# Patient Record
Sex: Male | Born: 1995 | Race: White | Hispanic: No | Marital: Single | Smoking: Current every day smoker
Health system: Southern US, Community
[De-identification: ages and names within clinical notes are randomized; demographics above are authoritative.]

---
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
## 2020-04-23 ENCOUNTER — Encounter (HOSPITAL_COMMUNITY): Payer: Self-pay

## 2020-04-23 ENCOUNTER — Emergency Department (HOSPITAL_COMMUNITY): Payer: Self-pay

## 2020-04-23 ENCOUNTER — Other Ambulatory Visit: Payer: Self-pay

## 2020-04-23 DIAGNOSIS — R0789 Other chest pain: Secondary | ICD-10-CM | POA: Insufficient documentation

## 2020-04-23 LAB — BASIC METABOLIC PANEL
Anion gap: 12 (ref 5–15)
BUN: 11 mg/dL (ref 6–20)
CO2: 25 mmol/L (ref 22–32)
Calcium: 10.2 mg/dL (ref 8.9–10.3)
Chloride: 103 mmol/L (ref 98–111)
Creatinine, Ser: 1.02 mg/dL (ref 0.61–1.24)
GFR, Estimated: >60 mL/min (ref >60)
Glucose, Bld: 108 mg/dL — ABNORMAL HIGH (ref 70–99)
Potassium: 3.6 mmol/L (ref 3.5–5.1)
Sodium: 140 mmol/L (ref 135–145)

## 2020-04-23 LAB — CBC
HCT: 46.5 % (ref 39.0–52.0)
Hemoglobin: 16.4 g/dL (ref 13.0–17.0)
MCH: 31.7 pg (ref 26.0–34.0)
MCHC: 35.3 g/dL (ref 30.0–36.0)
MCV: 89.8 fL (ref 80.0–100.0)
Platelets: 245 10*3/uL (ref 150–400)
RBC: 5.18 MIL/uL (ref 4.22–5.81)
RDW: 12.3 % (ref 11.5–15.5)
WBC: 7.2 10*3/uL (ref 4.0–10.5)
nRBC: 0 % (ref 0.0–0.2)

## 2020-04-23 LAB — TROPONIN I (HIGH SENSITIVITY): Troponin I (High Sensitivity): <2 ng/L (ref <18)

## 2020-04-23 NOTE — ED Triage Notes (Signed)
Pt reports left sided chest pain on going for a week. Pt describes it as tightness that increased today. Seen at hospital for same earlier.

## 2020-04-23 NOTE — ED Notes (Signed)
Blue top tube sent with save label with ordered labs.

## 2020-04-24 ENCOUNTER — Emergency Department (HOSPITAL_COMMUNITY)
Admission: EM | Admit: 2020-04-24 | Discharge: 2020-04-24 | Disposition: A | Discharge status: Home / Self Care | Payer: Self-pay | Attending: Emergency Medicine | Admitting: Emergency Medicine

## 2020-04-24 DIAGNOSIS — R0789 Other chest pain: Secondary | ICD-10-CM

## 2020-04-24 LAB — TROPONIN I (HIGH SENSITIVITY): Troponin I (High Sensitivity): <2 ng/L (ref <18)

## 2020-04-24 MED ORDER — METHOCARBAMOL 500 MG PO TABS: 500.0000 mg | ORAL_TABLET | Freq: Three times a day (TID) | ORAL | 0 refills | Status: DC | PRN

## 2020-04-24 MED ORDER — ALUM & MAG HYDROXIDE-SIMETH 200-200-20 MG/5ML PO SUSP
30.0000 mL | Freq: Once | ORAL | Status: AC
  Administered 2020-04-24: 30 mL via ORAL
  Filled 2020-04-24: qty 30

## 2020-04-24 MED ORDER — FAMOTIDINE 20 MG PO TABS
40.0000 mg | ORAL_TABLET | Freq: Once | ORAL | Status: AC
  Administered 2020-04-24: 40 mg via ORAL
  Filled 2020-04-24: qty 2

## 2020-04-24 MED ORDER — HYDROCODONE-ACETAMINOPHEN 5-325 MG PO TABS
1.0000 | ORAL_TABLET | Freq: Once | ORAL | Status: AC
  Administered 2020-04-24: 1 via ORAL
  Filled 2020-04-24: qty 1

## 2020-04-24 MED ORDER — LIDOCAINE VISCOUS HCL 2 % MT SOLN
15.0000 mL | Freq: Once | OROMUCOSAL | Status: AC
  Administered 2020-04-24: 15 mL via ORAL
  Filled 2020-04-24: qty 15

## 2020-04-24 NOTE — ED Notes (Signed)
Pt reports playing golf on Sunday 10/24, and he has been having this same pain since that day.

## 2020-04-24 NOTE — ED Provider Notes (Signed)
[facility]-EMERGENCY DEPT Provider Note   CSN: [id number] Arrival date & time: 04/23/20  2113     History Chief Complaint  Patient presents with  . Chest Pain    David Hurst is a 24 y.o. male.  Patient presents to the emergency department for evaluation of chest pain.  Patient has been experiencing left upper chest pain for 1 week.  Patient reports that he was seen at [facility] earlier today.  He was told that his heart was fine but he continued to have pain.  He was on his way to a party tonight when the pain worsened and he told his friends to bring him to the hospital.  He is not short of breath.  No trauma.  Area is not tender any cannot find any movements that reproduce the pain.        History reviewed. No pertinent past medical history.  There are no problems to display for this patient.   History reviewed. No pertinent surgical history.     No family history on file.  Social History   Tobacco Use  . Smoking status: Never Smoker  . Smokeless tobacco: Never Used  Substance Use Topics  . Alcohol use: No  . Drug use: Not on file    Home Medications Prior to Admission medications   Not on File    Allergies    Patient has no known allergies.  Review of Systems   Review of Systems  Cardiovascular: Positive for chest pain.  All other systems reviewed and are negative.   Physical Exam Updated Vital Signs BP (!) 153/89 (BP Location: Right Arm)   Pulse 88   Temp 98.4 F (36.9 C) (Oral)   Resp 10   Ht 5\' 7"  (1.702 m)   Wt 63.5 kg   SpO2 99%   BMI 21.93 kg/m   Physical Exam Vitals and nursing note reviewed.  Constitutional:      General: He is not in acute distress.    Appearance: Normal appearance. He is well-developed.  HENT:     Head: Normocephalic and atraumatic.     Right Ear: Hearing normal.     Left Ear: Hearing normal.     Nose: Nose normal.  Eyes:     Conjunctiva/sclera: Conjunctivae normal.      Pupils: Pupils are equal, round, and reactive to light.  Cardiovascular:     Rate and Rhythm: Regular rhythm.     Heart sounds: S1 normal and S2 normal. No murmur heard.  No friction rub. No gallop.   Pulmonary:     Effort: Pulmonary effort is normal. No respiratory distress.     Breath sounds: Normal breath sounds.  Chest:     Chest wall: No tenderness.  Abdominal:     General: Bowel sounds are normal.     Palpations: Abdomen is soft.     Tenderness: There is no abdominal tenderness. There is no guarding or rebound. Negative signs include Murphy's sign and McBurney's sign.     Hernia: No hernia is present.  Musculoskeletal:        General: Normal range of motion.     Cervical back: Normal range of motion and neck supple.  Skin:    General: Skin is warm and dry.     Findings: No rash.  Neurological:     Mental Status: He is alert and oriented to person, place, and time.     GCS: GCS eye subscore is 4. GCS verbal  subscore is 5. GCS motor subscore is 6.     Cranial Nerves: No cranial nerve deficit.     Sensory: No sensory deficit.     Coordination: Coordination normal.  Psychiatric:        Speech: Speech normal.        Behavior: Behavior normal.        Thought Content: Thought content normal.     ED Results / Procedures / Treatments   Labs (all labs ordered are listed, but only abnormal results are displayed) Labs Reviewed  BASIC METABOLIC PANEL - Abnormal; Notable for the following components:      Result Value   Glucose, Bld 108 (*)    All other components within normal limits  CBC  TROPONIN I (HIGH SENSITIVITY)  TROPONIN I (HIGH SENSITIVITY)    EKG EKG Interpretation  Date/Time:  Friday April 23 2020 21:26:13 EDT Ventricular Rate:  107 PR Interval:    QRS Duration: 88 QT Interval:  344 QTC Calculation: 459 R Axis:   84 Text Interpretation: Sinus tachycardia Right atrial enlargement ST elev, probable normal early repol pattern 12 Lead; Mason-Likar No  significant change since last tracing Confirmed by Gilda Crease [phone]) on 04/24/2020 12:15:34 AM   Radiology DG Chest 2 View  Result Date: 04/23/2020 CLINICAL DATA:  Left-sided chest pain x1 week. EXAM: CHEST - 2 VIEW COMPARISON:  April 23, 2020 FINDINGS: The heart size and mediastinal contours are within normal limits. Both lungs are clear. The visualized skeletal structures are unremarkable. IMPRESSION: No active cardiopulmonary disease. Electronically Signed   By: Aram Candela M.D.   On: 04/23/2020 21:57    Procedures Procedures (including critical care time)  Medications Ordered in ED Medications  famotidine (PEPCID) tablet 40 mg (has no administration in time range)  alum & mag hydroxide-simeth (MAALOX/MYLANTA) 200-200-20 MG/5ML suspension 30 mL (has no administration in time range)    And  lidocaine (XYLOCAINE) 2 % viscous mouth solution 15 mL (has no administration in time range)  HYDROcodone-acetaminophen (NORCO/VICODIN) 5-325 MG per tablet 1 tablet (has no administration in time range)    ED Course  I have reviewed the triage vital signs and the nursing notes.  Pertinent labs & imaging results that were available during my care of the patient were reviewed by me and considered in my medical decision making (see chart for details).    MDM Rules/Calculators/A&P                          Patient presents to the emergency department with chest pain.  Pain has been present for 1 week.  He has not identified anything that makes it better or worse.  He does not have any cardiac risk factors.  He is PERC/Wells negative.  Work-up here has been reassuring.  EKG shows benign early repolarization, no concerning features.  Troponins negative.  I was able to access records from [facility].  His work-up there was thorough and negative as well including a D-dimer.  He was discharged and told to take Prilosec, did take 1 dose but did not get any improvement so he came  here.  Etiology of the pain is unclear.  I do agree that most likely cause would be GI.  Area is not tender to touch and I cannot reproduce the pain with movements of the torso or arm.  He does, however, report that it started after playing golf.  It still could be musculoskeletal, possible  intercostal muscle.  At this point there is nothing to indicate a serious or life-threatening illness.  Patient reassured, will discharge with analgesia.  Final Clinical Impression(s) / ED Diagnoses Final diagnoses:  Atypical chest pain    Rx / DC Orders ED Discharge Orders    None       Jackelin Correia, Canary Brim, MD 04/24/20 0126

## 2020-12-16 ENCOUNTER — Other Ambulatory Visit: Payer: Self-pay

## 2020-12-16 ENCOUNTER — Emergency Department
Admission: EM | Admit: 2020-12-16 | Discharge: 2020-12-16 | Disposition: A | Discharge status: Home / Self Care | Payer: Self-pay | Attending: Emergency Medicine | Admitting: Emergency Medicine

## 2020-12-16 ENCOUNTER — Encounter: Payer: Self-pay | Admitting: Emergency Medicine

## 2020-12-16 DIAGNOSIS — A499 Bacterial infection, unspecified: Secondary | ICD-10-CM | POA: Insufficient documentation

## 2020-12-16 DIAGNOSIS — L089 Local infection of the skin and subcutaneous tissue, unspecified: Secondary | ICD-10-CM

## 2020-12-16 MED ORDER — HYDROXYZINE HCL 50 MG PO TABS: 50.0000 mg | ORAL_TABLET | Freq: Three times a day (TID) | ORAL | 0 refills | Status: AC | PRN

## 2020-12-16 MED ORDER — SULFAMETHOXAZOLE-TRIMETHOPRIM 800-160 MG PO TABS: 1.0000 | ORAL_TABLET | Freq: Two times a day (BID) | ORAL | 0 refills | Status: AC

## 2020-12-16 MED ORDER — METHYLPREDNISOLONE 4 MG PO TBPK: ORAL_TABLET | ORAL | 0 refills | Status: AC

## 2020-12-16 NOTE — ED Notes (Signed)
See triage note  Presents with swelling to upper lip  States he woke up with swelling to upper lip after shaving on Sunday

## 2020-12-16 NOTE — ED Triage Notes (Signed)
Pt reports shaved Monday and then Tuesday his top lip was swollen. Pt reports not really painful but still swollen

## 2020-12-16 NOTE — Discharge Instructions (Addendum)
Advised to avoid shaving for 10 days.  Use antibacterial soap provided for discharge.  Take medication as directed.

## 2020-12-16 NOTE — ED Provider Notes (Signed)
[facility] Emergency Department Provider Note   ____________________________________________   Event Date/Time   First MD Initiated Contact with Patient 12/16/20 [id number]     (approximate)  I have reviewed the triage vital signs and the nursing notes.   HISTORY  Chief Complaint Mouth Injury    HPI David Hurst is a 25 y.o. male patient presents with edema and erythema to the upper lip.  Patient also has crusted lesions on the upper lip.  Onset of complaint after shaving with a dull razor 1 week ago.  Patient states awaking this morning with yellow-greenish drainage from area.  Denies fever or pain.  No palliative measure for complaint.      History reviewed. No pertinent past medical history.  There are no problems to display for this patient.   History reviewed. No pertinent surgical history.  Prior to Admission medications   Medication Sig Start Date End Date Taking? Authorizing Provider  hydrOXYzine (ATARAX/VISTARIL) 50 MG tablet Take 1 tablet (50 mg total) by mouth 3 (three) times daily as needed for itching. 12/16/20  Yes Joni Reining, PA-C  methylPREDNISolone (MEDROL DOSEPAK) 4 MG TBPK tablet Take Tapered dose as directed 12/16/20  Yes Joni Reining, PA-C  sulfamethoxazole-trimethoprim (BACTRIM DS) 800-160 MG tablet Take 1 tablet by mouth 2 (two) times daily. 12/16/20  Yes Joni Reining, PA-C    Allergies Patient has no known allergies.  No family history on file.  Social History Social History   Tobacco Use   Smoking status: Never   Smokeless tobacco: Never  Substance Use Topics   Alcohol use: No    Review of Systems Constitutional: No fever/chills Eyes: No visual changes. ENT: No sore throat. Cardiovascular: Denies chest pain. Respiratory: Denies shortness of breath. Gastrointestinal: No abdominal pain.  No nausea, no vomiting.  No diarrhea.  No constipation. Genitourinary: Negative for dysuria. Musculoskeletal:  Negative for back pain. Skin: Positive for rash. Neurological: Negative for headaches, focal weakness or numbness.   ____________________________________________   PHYSICAL EXAM:  VITAL SIGNS: ED Triage Vitals  Enc Vitals Group     BP 12/16/20 0735 (!) 107/93     Pulse Rate 12/16/20 0735 90     Resp 12/16/20 0735 18     Temp 12/16/20 0735 98.4 F (36.9 C)     Temp Source 12/16/20 0735 Oral     SpO2 12/16/20 0735 98 %     Weight 12/16/20 0730 140 lb (63.5 kg)     Height 12/16/20 0730 5\' 9"  (1.753 m)     Head Circumference --      Peak Flow --      Pain Score 12/16/20 0730 0     Pain Loc --      Pain Edu? --      Excl. in GC? --     Constitutional: Alert and oriented. Well appearing and in no acute distress. Eyes: Conjunctivae are normal. PERRL. EOMI. Head: Atraumatic. Nose: No congestion/rhinnorhea. Mouth/Throat: Mucous membranes are moist.  Oropharynx non-erythematous. Hematological/Lymphatic/Immunilogical: No cervical lymphadenopathy. Cardiovascular: Normal rate, regular rhythm. Grossly normal heart sounds.  Good peripheral circulation. Respiratory: Normal respiratory effort.  No retractions. Lungs CTAB. Neurologic:  Normal speech and language. No gross focal neurologic deficits are appreciated. No gait instability. Skin: Mild edema and erythema to the upper lip.  Dry yellowish-greenish secretions upper lip.   Psychiatric: Mood and affect are normal. Speech and behavior are normal.  ____________________________________________   LABS (all labs ordered are listed, but  only abnormal results are displayed)  Labs Reviewed - No data to display ____________________________________________  EKG   ____________________________________________  RADIOLOGY I, Joni Reining, personally viewed and evaluated these images (plain radiographs) as part of my medical decision making, as well as reviewing the written report by the radiologist.  ED MD interpretation:     Official radiology report(s): No results found.  ____________________________________________   PROCEDURES  Procedure(s) performed (including Critical Care):  Procedures   ____________________________________________   INITIAL IMPRESSION / ASSESSMENT AND PLAN / ED COURSE  As part of my medical decision making, I reviewed the following data within the electronic MEDICAL RECORD NUMBER         Patient presents with edema and erythema to the upper lip.  Patient complaint physical exam consistent of a bacterial skin infection.  Patient given antibacterial soap to wash area twice a day for 2 to 3 days.  Patient given prescription for Bactrim DS, prednisone, and Atarax.  Patient advised not to shave the area for least 1 week.  Patient [facility]  Return to ED if no improvement or worsening complaint.      ____________________________________________   FINAL CLINICAL IMPRESSION(S) / ED DIAGNOSES  Final diagnoses:  Bacterial skin infection     ED Discharge Orders          Ordered    sulfamethoxazole-trimethoprim (BACTRIM DS) 800-160 MG tablet  2 times daily        12/16/20 0742    methylPREDNISolone (MEDROL DOSEPAK) 4 MG TBPK tablet        12/16/20 0742    hydrOXYzine (ATARAX/VISTARIL) 50 MG tablet  3 times daily PRN        12/16/20 0539             Note:  This document was prepared using Dragon voice recognition software and may include unintentional dictation errors.    Joni Reining, PA-C 12/16/20 0751    Concha Se, MD 12/16/20 [phone]

## 2021-11-26 ENCOUNTER — Encounter: Payer: Self-pay | Admitting: Emergency Medicine

## 2021-11-26 ENCOUNTER — Emergency Department: Payer: Self-pay

## 2021-11-26 ENCOUNTER — Emergency Department
Admission: EM | Admit: 2021-11-26 | Discharge: 2021-11-26 | Disposition: A | Discharge status: Home / Self Care | Payer: Self-pay | Attending: Emergency Medicine | Admitting: Emergency Medicine

## 2021-11-26 ENCOUNTER — Other Ambulatory Visit: Payer: Self-pay

## 2021-11-26 DIAGNOSIS — K3580 Unspecified acute appendicitis: Secondary | ICD-10-CM | POA: Insufficient documentation

## 2021-11-26 DIAGNOSIS — D72829 Elevated white blood cell count, unspecified: Secondary | ICD-10-CM | POA: Insufficient documentation

## 2021-11-26 LAB — CBC
HCT: 45.9 % (ref 39.0–52.0)
Hemoglobin: 15.4 g/dL (ref 13.0–17.0)
MCH: 29.3 pg (ref 26.0–34.0)
MCHC: 33.6 g/dL (ref 30.0–36.0)
MCV: 87.4 fL (ref 80.0–100.0)
Platelets: 361 10*3/uL (ref 150–400)
RBC: 5.25 MIL/uL (ref 4.22–5.81)
RDW: 12.3 % (ref 11.5–15.5)
WBC: 15.6 10*3/uL — ABNORMAL HIGH (ref 4.0–10.5)
nRBC: 0 % (ref 0.0–0.2)

## 2021-11-26 LAB — COMPREHENSIVE METABOLIC PANEL
ALT: 21 U/L (ref 0–44)
AST: 20 U/L (ref 15–41)
Albumin: 4.2 g/dL (ref 3.5–5.0)
Alkaline Phosphatase: 71 U/L (ref 38–126)
Anion gap: 7 (ref 5–15)
BUN: 11 mg/dL (ref 6–20)
CO2: 26 mmol/L (ref 22–32)
Calcium: 8.7 mg/dL — ABNORMAL LOW (ref 8.9–10.3)
Chloride: 107 mmol/L (ref 98–111)
Creatinine, Ser: 0.76 mg/dL (ref 0.61–1.24)
GFR, Estimated: >60 mL/min (ref >60)
Glucose, Bld: 108 mg/dL — ABNORMAL HIGH (ref 70–99)
Potassium: 3.8 mmol/L (ref 3.5–5.1)
Sodium: 140 mmol/L (ref 135–145)
Total Bilirubin: 0.7 mg/dL (ref 0.3–1.2)
Total Protein: 7.6 g/dL (ref 6.5–8.1)

## 2021-11-26 LAB — URINALYSIS, ROUTINE W REFLEX MICROSCOPIC
Bilirubin Urine: NEGATIVE
Glucose, UA: NEGATIVE mg/dL
Hgb urine dipstick: NEGATIVE
Ketones, ur: NEGATIVE mg/dL
Leukocytes,Ua: NEGATIVE
Nitrite: NEGATIVE
Protein, ur: NEGATIVE mg/dL
Specific Gravity, Urine: 1.013 (ref 1.005–1.030)
pH: 7 (ref 5.0–8.0)

## 2021-11-26 LAB — LIPASE, BLOOD: Lipase: 30 U/L (ref 11–51)

## 2021-11-26 MED ORDER — PIPERACILLIN-TAZOBACTAM 3.375 G IVPB 30 MIN
3.3750 g | Freq: Once | INTRAVENOUS | Status: AC
  Administered 2021-11-26: 3.375 g via INTRAVENOUS
  Filled 2021-11-26: qty 50

## 2021-11-26 MED ORDER — ONDANSETRON HCL 4 MG/2ML IJ SOLN
4.0000 mg | Freq: Once | INTRAMUSCULAR | Status: AC
  Administered 2021-11-26: 4 mg via INTRAVENOUS
  Filled 2021-11-26: qty 2

## 2021-11-26 MED ORDER — KETOROLAC TROMETHAMINE 15 MG/ML IJ SOLN
15.0000 mg | Freq: Once | INTRAMUSCULAR | Status: AC
  Administered 2021-11-26: 15 mg via INTRAVENOUS
  Filled 2021-11-26: qty 1

## 2021-11-26 MED ORDER — AMOXICILLIN-POT CLAVULANATE 875-125 MG PO TABS: 1.0000 | ORAL_TABLET | Freq: Two times a day (BID) | ORAL | 0 refills | Status: AC

## 2021-11-26 MED ORDER — IOHEXOL 300 MG/ML  SOLN
100.0000 mL | Freq: Once | INTRAMUSCULAR | Status: AC | PRN
  Administered 2021-11-26: 100 mL via INTRAVENOUS

## 2021-11-26 NOTE — ED Provider Notes (Signed)
[facility] Provider Note    Event Date/Time   First MD Initiated Contact with Patient 11/26/21 1152     (approximate)   History   Abdominal Pain   HPI  David Hurst is a 26 y.o. male no past medical history no prior surgeries presents with right lower quadrant abdominal pain.  For about a week patient has had intermittent vomiting and diarrhea.  Tends to vomit in the morning and overnight but is tolerating p.o. during the day.  This was the first day he had abdominal pain.  Woke up with pain in the right lower quadrant.  It is constant nonradiating denies urinary symptoms no testicular pain or swelling    History reviewed. No pertinent past medical history.  There are no problems to display for this patient.    Physical Exam  Triage Vital Signs: ED Triage Vitals [11/26/21 1133]  Enc Vitals Group     BP 111/85     Pulse Rate 72     Resp 18     Temp 98.5 F (36.9 C)     Temp Source Oral     SpO2 99 %     Weight 140 lb (63.5 kg)     Height 5\' 8"  (1.727 m)     Head Circumference      Peak Flow      Pain Score 8     Pain Loc      Pain Edu?      Excl. in GC?     Most recent vital signs: Vitals:   11/26/21 1133 11/26/21 1400  BP: 111/85 116/71  Pulse: 72 78  Resp: 18 16  Temp: 98.5 F (36.9 C)   SpO2: 99% 100%     General: Awake, no distress.  CV:  Good peripheral perfusion.  Resp:  Normal effort.  Abd:  No distention.  Soft mildly tender in the right lower quadrant Neuro:             Awake, Alert, Oriented x 3  Other:     ED Results / Procedures / Treatments  Labs (all labs ordered are listed, but only abnormal results are displayed) Labs Reviewed  COMPREHENSIVE METABOLIC PANEL - Abnormal; Notable for the following components:      Result Value   Glucose, Bld 108 (*)    Calcium 8.7 (*)    All other components within normal limits  CBC - Abnormal; Notable for the following components:   WBC 15.6 (*)    All other  components within normal limits  URINALYSIS, ROUTINE W REFLEX MICROSCOPIC - Abnormal; Notable for the following components:   Color, Urine YELLOW (*)    APPearance CLEAR (*)    All other components within normal limits  LIPASE, BLOOD     EKG     RADIOLOGY  I reviewed and interpreted the CT abdomen pelvis which shows some enhancement of the wall of the appendix   PROCEDURES:  Critical Care performed: No  Procedures  The patient is on the cardiac monitor to evaluate for evidence of arrhythmia and/or significant heart rate changes.   MEDICATIONS ORDERED IN ED: Medications  piperacillin-tazobactam (ZOSYN) IVPB 3.375 g (3.375 g Intravenous New Bag/Given 11/26/21 1446)  ketorolac (TORADOL) 15 MG/ML injection 15 mg (15 mg Intravenous Given 11/26/21 1232)  ondansetron (ZOFRAN) injection 4 mg (4 mg Intravenous Given 11/26/21 1232)  iohexol (OMNIPAQUE) 300 MG/ML solution 100 mL (100 mLs Intravenous Contrast Given 11/26/21 1346)  IMPRESSION / MDM / ASSESSMENT AND PLAN / ED COURSE  I reviewed the triage vital signs and the nursing notes.                              Patient's presentation is most consistent with acute presentation with potential threat to life or bodily function.  Differential diagnosis includes, but is not limited to, appendicitis, gastroenteritis, renal colic, inflammatory bowel disease, mesenteric adenitis  Patient is a 26 year old male presenting with right lower quadrant pain.  Has had about a week of intermittent vomiting and diarrhea but still tolerating p.o. intermittently.  The pain started this morning and is located in the right lower quadrant.  He appears well on exam overall his abdomen is benign and he does not guard but have subjective pain on palpation of the right lower quadrant.  He has a leukocytosis of 15.  Appendicitis is most concerning on the differential.  We will obtain a CT abdomen pelvis treat pain with Toradol and nausea with Zofran.  He is  NPO.  CT is concerning for potential early appendicitis there is enhancement of the wall and appendix is dilated but there is no inflammatory changes.  I spoke with Dr. Claudine Mouton general surgery who evaluate the patient.  Patient was evaluated by Dr. Claudine Mouton.  Dr. Claudine Mouton discussed with me that he thinks this is likely a very early appendicitis.  He had a discussion with the patient about antibiotics versus surgery and the patient preferred the nonsurgical route.  He recommends discharge with oral Augmentin after getting a dose of IV antibiotics in the ED.  I did question whether would be more beneficial to admit the patient for IV antibiotics if not pursuing the surgical route.  Apparently they discussed admission for IV antibiotics, admission for surgery versus IV antibiotic in the ED and then discharged with outpatient antibiotics.  Patient preferred to be discharged after dose of IV antibiotics in the emergency department.  I discussed this with the patient as well and he does not want to be admitted.  Had discussion with him about immediate return to the ED for worsening symptoms and I explained the risk is for the appendicitis to worsen into actual rupture which would make his course much more complicated.  Dr. Claudine Mouton was comfortable with the patient being discharged with Augmentin.  Will not give anything for pain as will need to see if pain is worsening.  Patient's pain is controlled he is tolerating p.o.   FINAL CLINICAL IMPRESSION(S) / ED DIAGNOSES   Final diagnoses:  Acute appendicitis, unspecified acute appendicitis type     Rx / DC Orders   ED Discharge Orders          Ordered    amoxicillin-clavulanate (AUGMENTIN) 875-125 MG tablet  2 times daily        11/26/21 1514             Note:  This document was prepared using Dragon voice recognition software and may include unintentional dictation errors.   Georga Hacking, MD 11/26/21 1515

## 2021-11-26 NOTE — Discharge Instructions (Addendum)
You CAT scan shows very early appendicitis.  You received a dose of IV antibiotics in the emergency department.  Please take the antibiotic twice a day for the next 14 days.  It is very important that you return to the emergency department if your pain is worsening.  Follow-up with Dr. Claudine Mouton in the office next week.

## 2021-11-26 NOTE — ED Triage Notes (Signed)
Pt via POV from home. Pt c/o RLQ abd pain that started this AM. Also, endorses NVD. Denies fever. Denies urinary symptoms. Denies hx of abd surgeries or kidney stones. Pt is A&OX4 and NAD

## 2021-11-26 NOTE — Consult Note (Signed)
[facility] SURGICAL CONSULTATION NOTE (initial) - cpt: 71245 (Outpatient/ED)   HISTORY OF PRESENT ILLNESS (HPI):  26 y.o. male presented to [facility] ED after a three 3 to 4-day history of vague malaise, nausea and anorexia, vomiting and loose stools, he presents today for evaluation of right lower quadrant pain. Patient reports awakening this morning with right lower quadrant pain/tenderness.  Denies fevers and chills,  Surgery is consulted by ED physician Dr. Sidney Ace in this context for evaluation and management of possible early acute appendicitis.  Has white blood cell count of 15, CT scans consistent with possible early acute appendicitis.  PAST MEDICAL HISTORY (PMH):  History reviewed. No pertinent past medical history.   PAST SURGICAL HISTORY (PSH):  No past surgical history on file.   MEDICATIONS:  Prior to Admission medications   Medication Sig Start Date End Date Taking? Authorizing Provider  hydrOXYzine (ATARAX/VISTARIL) 50 MG tablet Take 1 tablet (50 mg total) by mouth 3 (three) times daily as needed for itching. 12/16/20   Joni Reining, PA-C  methylPREDNISolone (MEDROL DOSEPAK) 4 MG TBPK tablet Take Tapered dose as directed 12/16/20   Joni Reining, PA-C  sulfamethoxazole-trimethoprim (BACTRIM DS) 800-160 MG tablet Take 1 tablet by mouth 2 (two) times daily. 12/16/20   Joni Reining, PA-C     ALLERGIES:  No Known Allergies   SOCIAL HISTORY:  Social History   Socioeconomic History   Marital status: Single    Spouse name: Not on file   Number of children: Not on file   Years of education: Not on file   Highest education level: Not on file  Occupational History   Not on file  Tobacco Use   Smoking status: Every Day    Packs/day: 0.50    Types: Cigarettes   Smokeless tobacco: Never  Substance and Sexual Activity   Alcohol use: Yes   Drug use: Yes    Types: Marijuana   Sexual activity: Not on file  Other Topics Concern   Not on file  Social  History Narrative   Not on file   Social Determinants of Health   Financial Resource Strain: Not on file  Food Insecurity: Not on file  Transportation Needs: Not on file  Physical Activity: Not on file  Stress: Not on file  Social Connections: Not on file  Intimate Partner Violence: Not on file     FAMILY HISTORY:  No family history on file.    REVIEW OF SYSTEMS:  Review of Systems  Constitutional:  Positive for malaise/fatigue. Negative for chills and fever.  HENT: Negative.    Eyes: Negative.   Respiratory: Negative.    Cardiovascular: Negative.   Gastrointestinal:  Positive for abdominal pain, diarrhea, nausea and vomiting.  Genitourinary: Negative.   Skin: Negative.   Neurological: Negative.   Psychiatric/Behavioral: Negative.     VITAL SIGNS:  Temp:  [98.5 F (36.9 C)] 98.5 F (36.9 C) (06/03 1133) Pulse Rate:  [72-78] 78 (06/03 1400) Resp:  [16-18] 16 (06/03 1400) BP: (111-116)/(71-85) 116/71 (06/03 1400) SpO2:  [99 %-100 %] 100 % (06/03 1400) Weight:  [63.5 kg] 63.5 kg (06/03 1133)     Height: 5\' 8"  (172.7 cm) Weight: 63.5 kg BMI (Calculated): 21.29   INTAKE/OUTPUT:  No intake/output data recorded.  PHYSICAL EXAM:  Physical Exam Blood pressure 116/71, pulse 78, temperature 98.5 F (36.9 C), temperature source Oral, resp. rate 16, height 5\' 8"  (1.727 m), weight 63.5 kg, SpO2 100 %. Last Weight  Most recent update:  11/26/2021 11:34 AM    Weight  63.5 kg (140 lb)             CONSTITUTIONAL: Well developed, and nourished, appropriately responsive and aware without distress.   EYES: Sclera non-icteric.   EARS, NOSE, MOUTH AND THROAT:  The oropharynx is clear. Oral mucosa is pink and moist.   Hearing is intact to voice.  NECK: Trachea is midline, and there is no jugular venous distension.  LYMPH NODES:  Lymph nodes in the neck are not enlarged. RESPIRATORY:  Lungs are clear, and breath sounds are equal bilaterally. Normal respiratory effort without  pathologic use of accessory muscles. CARDIOVASCULAR: Heart is regular in rate and rhythm. GI: The abdomen is soft, nontender, and nondistended.  Currently there is some subjective right lower quadrant tenderness, but there is no voluntary involuntary guarding.  There seems to be no limitation in his right hip mobility.  There were no palpable masses. I did not appreciate hepatosplenomegaly. There were normal bowel sounds.  MUSCULOSKELETAL:  Symmetrical muscle tone appreciated in all four extremities.    SKIN: Skin turgor is normal. No pathologic skin lesions appreciated.  NEUROLOGIC:  Motor and sensation appear grossly normal.  Cranial nerves are grossly without defect. PSYCH:  Alert and oriented to person, place and time. Affect is appropriate for situation.  Data Reviewed I have personally reviewed what is currently available of the patient's imaging, recent labs and medical records.    Labs:     Latest Ref Rng & Units 11/26/2021   11:36 AM 04/23/2020    9:31 PM 10/21/2013    1:17 AM  CBC  WBC 4.0 - 10.5 K/uL 15.6   7.2     Hemoglobin 13.0 - 17.0 g/dL 77.8   24.2   35.3    Hematocrit 39.0 - 52.0 % 45.9   46.5   45.0    Platelets 150 - 400 K/uL 361   245         Latest Ref Rng & Units 11/26/2021   11:36 AM 04/23/2020    9:31 PM 10/21/2013    1:17 AM  CMP  Glucose 70 - 99 mg/dL 614   431   540    BUN 6 - 20 mg/dL 11   11   10     Creatinine 0.61 - 1.24 mg/dL   0.86   7.61    Sodium 135 - 145 mmol/L 140   140   141    Potassium 3.5 - 5.1 mmol/L 3.8   3.6   3.1    Chloride 98 - 111 mmol/L 107   103   97    CO2 22 - 32 mmol/L 26   25     Calcium 8.9 - 10.3 mg/dL 8.7   9.50     Total Protein 6.5 - 8.1 g/dL 7.6      Total Bilirubin 0.3 - 1.2 mg/dL 0.7      Alkaline Phos 38 - 126 U/L 71      AST 15 - 41 U/L 20      ALT 0 - 44 U/L 21         Imaging studies:   Last 24 hrs: CT ABDOMEN PELVIS W CONTRAST  Result Date: 11/26/2021 CLINICAL DATA:  26 year old with right lower  quadrant abdominal pain. EXAM: CT ABDOMEN AND PELVIS WITH CONTRAST TECHNIQUE: Multidetector CT imaging of the abdomen and pelvis was performed using the standard protocol following bolus administration of intravenous contrast. RADIATION DOSE REDUCTION: This  exam was performed according to the departmental dose-optimization program which includes automated exposure control, adjustment of the mA and/or kV according to patient size and/or use of iterative reconstruction technique. CONTRAST:  100mL OMNIPAQUE IOHEXOL 300 MG/ML  SOLN COMPARISON:  None Available. FINDINGS: Lower chest: Lung bases are clear.  No pleural effusions. Hepatobiliary: Normal appearance of the liver, gallbladder and portal venous system. No biliary dilatation. Pancreas: Unremarkable. No pancreatic ductal dilatation or surrounding inflammatory changes. Spleen: Normal in size without focal abnormality. Adrenals/Urinary Tract: Normal adrenal glands. Normal appearance of both kidneys without hydronephrosis. No suspicious renal lesions. Normal appearance of the urinary bladder. Stomach/Bowel: Appendix is slightly prominent in the right lower quadrant on sequence 2 image 63 and sequence 5 image 33. Appendix measures roughly 6 mm in diameter with wall enhancement near the tip. These findings are equivocal with regards to an early appendicitis. There is no significant fluid or stranding in this area. Normal appearance of the small bowel and stomach. No evidence for bowel dilatation or obstruction. Vascular/Lymphatic: No significant vascular findings are present. No enlarged abdominal or pelvic lymph nodes. Reproductive: Prostate is unremarkable. Other: Negative for free fluid.  Negative for free air. Musculoskeletal: No acute bone abnormality. IMPRESSION: 1. Appendix is slightly prominent with wall enhancement in the right lower quadrant. No significant inflammatory changes around the appendix. These appendix findings are equivocal. Cannot exclude early  appendicitis. These results were called by telephone at the time of interpretation on 11/26/2021 at 2:05 pm to provider [facility]KELLY MCHUGH , who verbally acknowledged these results. Electronically Signed   By: Richarda OverlieAdam  Henn M.D.   On: 11/26/2021 14:06     Assessment/Plan:  26 y.o. male with likely early acute appendicitis.  There are no problems to display for this patient.  The risks, benefits, complications, treatment options, and expected outcomes were discussed with the patient. The treatment of antibiotics alone was discussed giving a 20% chance that this could fail and surgery would be necessary.  Also discussed proceeding to the operating room for Laparoscopic Appendectomy.  The possibilities of  bleeding, recurrent infection, perforation of viscus, finding a normal appendix, the need for additional procedures, failure to diagnose a condition, conversion to open procedure and creating a complication requiring transfusion or further operations were discussed. The patient was given the opportunity to ask questions and have them answered.    Patient would like to avoid surgery at this time, opting for an initial dose of IV antibiotics and oral antibiotics with discharge home.  We gave him an option of admission for IV antibiotics to be observed over the next 24 hours, but he would rather be discharged.  He is well aware that should he fail to progress over the next day or 2, we should reevaluate proceeding with surgery.   We discussed that we could not prescribe pain medications with his oral course of antibiotics, as I would not want to mask his abdominal pain.   All of the above findings and recommendations were discussed with the patient and  family(if present), and all of patient's and present family's questions were answered to their expressed satisfaction.  Thank you for the opportunity to participate in this patient's care.   -- Campbell Lernerenny Ambreen Tufte, M.D., FACS 11/26/2021, 3:03 PM

## 2022-01-04 ENCOUNTER — Emergency Department
Admission: EM | Admit: 2022-01-04 | Discharge: 2022-01-04 | Disposition: A | Discharge status: Home / Self Care | Payer: Self-pay | Attending: Emergency Medicine | Admitting: Emergency Medicine

## 2022-01-04 ENCOUNTER — Other Ambulatory Visit: Payer: Self-pay

## 2022-01-04 ENCOUNTER — Encounter: Payer: Self-pay | Admitting: Intensive Care

## 2022-01-04 ENCOUNTER — Emergency Department: Payer: Self-pay

## 2022-01-04 DIAGNOSIS — R630 Anorexia: Secondary | ICD-10-CM | POA: Insufficient documentation

## 2022-01-04 DIAGNOSIS — K5900 Constipation, unspecified: Secondary | ICD-10-CM | POA: Insufficient documentation

## 2022-01-04 LAB — COMPREHENSIVE METABOLIC PANEL
ALT: 14 U/L (ref 0–44)
AST: 15 U/L (ref 15–41)
Albumin: 3.9 g/dL (ref 3.5–5.0)
Alkaline Phosphatase: 65 U/L (ref 38–126)
Anion gap: 10 (ref 5–15)
BUN: 8 mg/dL (ref 6–20)
CO2: 29 mmol/L (ref 22–32)
Calcium: 8.9 mg/dL (ref 8.9–10.3)
Chloride: 104 mmol/L (ref 98–111)
Creatinine, Ser: 0.99 mg/dL (ref 0.61–1.24)
GFR, Estimated: >60 mL/min (ref >60)
Glucose, Bld: 103 mg/dL — ABNORMAL HIGH (ref 70–99)
Potassium: 4.3 mmol/L (ref 3.5–5.1)
Sodium: 143 mmol/L (ref 135–145)
Total Bilirubin: 0.5 mg/dL (ref 0.3–1.2)
Total Protein: 7.2 g/dL (ref 6.5–8.1)

## 2022-01-04 LAB — CBC
HCT: 46.6 % (ref 39.0–52.0)
Hemoglobin: 15.4 g/dL (ref 13.0–17.0)
MCH: 29.5 pg (ref 26.0–34.0)
MCHC: 33 g/dL (ref 30.0–36.0)
MCV: 89.3 fL (ref 80.0–100.0)
Platelets: 349 10*3/uL (ref 150–400)
RBC: 5.22 MIL/uL (ref 4.22–5.81)
RDW: 12.1 % (ref 11.5–15.5)
WBC: 6.9 10*3/uL (ref 4.0–10.5)
nRBC: 0 % (ref 0.0–0.2)

## 2022-01-04 LAB — LIPASE, BLOOD: Lipase: 51 U/L (ref 11–51)

## 2022-01-04 MED ORDER — IOHEXOL 300 MG/ML  SOLN
100.0000 mL | Freq: Once | INTRAMUSCULAR | Status: AC | PRN
  Administered 2022-01-04: 100 mL via INTRAVENOUS

## 2022-01-04 MED ORDER — POLYETHYLENE GLYCOL 3350 17 G PO PACK: 17.0000 g | PACK | Freq: Every day | ORAL | 0 refills | Status: AC

## 2022-01-04 MED ORDER — SENNA 8.6 MG PO TABS: 1.0000 | ORAL_TABLET | Freq: Every day | ORAL | 0 refills | Status: AC

## 2022-01-04 NOTE — ED Provider Notes (Signed)
[facility] Provider Note    Event Date/Time   First MD Initiated Contact with Patient 01/04/22 1222     (approximate)   History   Abdominal Pain   HPI  David Hurst is a 26 y.o. male with past medical history of prior early appendicitis treated medically who presents with abdominal pain constipation.  Symptoms started about a week and a half ago.  He endorses significant constipation.  Typically has a BM every day has not had a bowel movement about a week.  Has had some nausea but no vomiting also not much appetite not eating very well.  Has not tried any over-the-counter stool softeners.  Has bilateral lower quadrant abdominal pain worse on the right compared to left.  Denies vomiting fevers chills.  Inks he has lost weight in the last week.  Patient was seen in the ED over a month ago with CT that was concerning for possible early appendicitis, general surgery was consulted and patient was ultimately discharged on oral antibiotics.  He says he was feeling better while taking the antibiotics.    History reviewed. No pertinent past medical history.  There are no problems to display for this patient.    Physical Exam  Triage Vital Signs: ED Triage Vitals  Enc Vitals Group     BP 01/04/22 1110 101/90     Pulse Rate 01/04/22 1110 93     Resp 01/04/22 1110 16     Temp 01/04/22 1110 98.3 F (36.8 C)     Temp Source 01/04/22 1110 Oral     SpO2 01/04/22 1110 100 %     Weight 01/04/22 1115 130 lb (59 kg)     Height 01/04/22 1115 5\' 8"  (1.727 m)     Head Circumference --      Peak Flow --      Pain Score 01/04/22 1115 5     Pain Loc --      Pain Edu? --      Excl. in GC? --     Most recent vital signs: Vitals:   01/04/22 1110  BP: 101/90  Pulse: 93  Resp: 16  Temp: 98.3 F (36.8 C)  SpO2: 100%     General: Awake, no distress.  CV:  Good peripheral perfusion.  Resp:  Normal effort.  Abd:  No distention.  Minimal tenderness in the right  lower quadrant no guarding Neuro:             Awake, Alert, Oriented x 3  Other:     ED Results / Procedures / Treatments  Labs (all labs ordered are listed, but only abnormal results are displayed) Labs Reviewed  COMPREHENSIVE METABOLIC PANEL - Abnormal; Notable for the following components:      Result Value   Glucose, Bld 103 (*)    All other components within normal limits  LIPASE, BLOOD  CBC  URINALYSIS, ROUTINE W REFLEX MICROSCOPIC     EKG     RADIOLOGY    PROCEDURES:  Critical Care performed: No  Procedures  The patient is on the cardiac monitor to evaluate for evidence of arrhythmia and/or significant heart rate changes.   MEDICATIONS ORDERED IN ED: Medications  iohexol (OMNIPAQUE) 300 MG/ML solution 100 mL (100 mLs Intravenous Contrast Given 01/04/22 1317)     IMPRESSION / MDM / ASSESSMENT AND PLAN / ED COURSE  I reviewed the triage vital signs and the nursing notes.  Patient's presentation is most consistent with acute presentation with potential threat to life or bodily function.  Differential diagnosis includes, but is not limited to, appendicitis, bowel obstruction, constipation  Patient is a 26 year old male presenting with about a week and a half of constipation abdominal pain decreased appetite.  Treated last month with oral antibiotics for possible early appendicitis.  Initially felt better but has had worsening symptoms over the last week and a half.  Pain does localize to the right lower quadrant says is not as significant as last time he was here.  Has not had fevers chills.  Vitals are unremarkable and patient looks quite well abdominal exam overall is benign mild tenderness in the right lower quadrant no guarding.  Given his history we will repeat CT today to ensure no complication of appendicitis treated medically.     CT abdomen pelvis shows large stool burden normal appendix.  There is small bowel small bowel  intussusception which radiology comments is usually benign and transient finding.  I did discuss with Dr. Maia Plan he agrees that this is not an acute surgical process.  We will start patient on aggressive bowel regimen with MiraLAX and senna.  We discussed lifestyle changes as well.  Appropriate for discharge.  FINAL CLINICAL IMPRESSION(S) / ED DIAGNOSES   Final diagnoses:  Constipation, unspecified constipation type     Rx / DC Orders   ED Discharge Orders     None        Note:  This document was prepared using Dragon voice recognition software and may include unintentional dictation errors.   Georga Hacking, MD 01/04/22 1421

## 2022-01-04 NOTE — ED Triage Notes (Signed)
Patient c/o right lower quadrant pain that flared up again last week. Reports being seen on 11/26/21 and diagnosed with early appendicitis. Reports diarrhea

## 2022-01-04 NOTE — Discharge Instructions (Addendum)
Please start taking MiraLAX twice a day and the senna daily for constipation.  Please make sure you are staying hydrated and eating a good amount of fiber.  Please follow-up with primary care.

## 2022-01-05 ENCOUNTER — Ambulatory Visit: Payer: Self-pay | Admitting: Surgery

## 2022-04-01 ENCOUNTER — Other Ambulatory Visit: Payer: Self-pay

## 2022-04-01 ENCOUNTER — Emergency Department (HOSPITAL_COMMUNITY)
Admission: EM | Admit: 2022-04-01 | Discharge: 2022-04-02 | Disposition: A | Discharge status: Home / Self Care | Payer: Self-pay | Attending: Emergency Medicine | Admitting: Emergency Medicine

## 2022-04-01 DIAGNOSIS — F10920 Alcohol use, unspecified with intoxication, uncomplicated: Secondary | ICD-10-CM

## 2022-04-01 DIAGNOSIS — F10129 Alcohol abuse with intoxication, unspecified: Secondary | ICD-10-CM | POA: Insufficient documentation

## 2022-04-01 DIAGNOSIS — Y907 Blood alcohol level of 200-239 mg/100 ml: Secondary | ICD-10-CM | POA: Insufficient documentation

## 2022-04-01 MED ORDER — DROPERIDOL 2.5 MG/ML IJ SOLN
5.0000 mg | Freq: Once | INTRAMUSCULAR | Status: DC
  Filled 2022-04-01: qty 2

## 2022-04-01 MED ORDER — LORAZEPAM 2 MG/ML IJ SOLN
2.0000 mg | Freq: Once | INTRAMUSCULAR | Status: AC
  Administered 2022-04-01: 2 mg via INTRAMUSCULAR
  Filled 2022-04-01: qty 1

## 2022-04-01 MED ORDER — DROPERIDOL 2.5 MG/ML IJ SOLN
5.0000 mg | Freq: Once | INTRAMUSCULAR | Status: AC
Start: 2022-04-01 — End: 2022-04-01
  Administered 2022-04-01: 5 mg via INTRAMUSCULAR

## 2022-04-01 NOTE — ED Notes (Signed)
Unable to obtain Temp.

## 2022-04-01 NOTE — ED Triage Notes (Signed)
Pt BIB EMS with reports of ETOH. Pt is alert, non ambulatory.

## 2022-04-01 NOTE — ED Provider Notes (Signed)
[facility] Note   CSN: [id number] Arrival date & time: 04/01/22  2216     History {Add pertinent medical, surgical, social history, OB history to HPI:1} Chief Complaint  Patient presents with   Alcohol Intoxication    David Hurst is a 26 y.o. male who presents via EMS with reports of alcohol use and cocaine use at a house party.  Bystanders called EMS due to erratic behavior.  Patient adamantly denying any alcohol use though is obviously intoxicated.   I personally read patient's medical record 3 does not have any history documented On chart in regards to medical diagnoses but has had intermittent ED visits over the last few years.  No documented medications daily.  HPI     Home Medications Prior to Admission medications   Medication Sig Start Date End Date Taking? Authorizing Provider  hydrOXYzine (ATARAX/VISTARIL) 50 MG tablet Take 1 tablet (50 mg total) by mouth 3 (three) times daily as needed for itching. Patient not taking: Reported on 01/04/2022 12/16/20   Joni Reining, PA-C  methylPREDNISolone (MEDROL DOSEPAK) 4 MG TBPK tablet Take Tapered dose as directed Patient not taking: Reported on 01/04/2022 12/16/20   Joni Reining, PA-C  sulfamethoxazole-trimethoprim (BACTRIM DS) 800-160 MG tablet Take 1 tablet by mouth 2 (two) times daily. Patient not taking: Reported on 01/04/2022 12/16/20   Joni Reining, PA-C      Allergies    Patient has no known allergies.    Review of Systems   Review of Systems  Unable to perform ROS: Other (intoxication)    Physical Exam Updated Vital Signs BP (!) 149/122 (BP Location: Left Arm)   Pulse 88   Resp 16   Ht 5\' 8"  (1.727 m)   Wt 63.5 kg   SpO2 92%   BMI 21.29 kg/m  Physical Exam Vitals and nursing note reviewed.  Constitutional:      Appearance: He is not ill-appearing or toxic-appearing.  HENT:     Head: Normocephalic and atraumatic.     Nose: Nose normal.      Mouth/Throat:     Mouth: Mucous membranes are moist.     Pharynx: No oropharyngeal exudate or posterior oropharyngeal erythema.  Eyes:     General:        Right eye: No discharge.        Left eye: No discharge.     Extraocular Movements: Extraocular movements intact.     Conjunctiva/sclera: Conjunctivae normal.     Pupils: Pupils are equal, round, and reactive to light.  Cardiovascular:     Rate and Rhythm: Normal rate and regular rhythm.     Pulses: Normal pulses.     Heart sounds: Normal heart sounds. No murmur heard. Pulmonary:     Effort: Pulmonary effort is normal. No respiratory distress.     Breath sounds: Normal breath sounds. No wheezing or rales.  Abdominal:     General: Bowel sounds are normal. There is no distension.     Palpations: Abdomen is soft.     Tenderness: There is no abdominal tenderness. There is no guarding or rebound.  Musculoskeletal:        General: No deformity.     Cervical back: Neck supple.     Right lower leg: No edema.     Left lower leg: No edema.  Skin:    General: Skin is warm and dry.     Capillary Refill: Capillary refill takes less than 2 seconds.  Neurological:     General: No focal deficit present.     Mental Status: He is alert and oriented to person, place, and time. Mental status is at baseline.  Psychiatric:     Comments: Clearly intoxicated with unknown substances, likely ETOH per report.      ED Results / Procedures / Treatments   Labs (all labs ordered are listed, but only abnormal results are displayed) Labs Reviewed - No data to display  EKG None  Radiology No results found.  Procedures Procedures  {Document cardiac monitor, telemetry assessment procedure when appropriate:1}  Medications Ordered in ED Medications - No data to display  ED Course/ Medical Decision Making/ A&P                           Medical Decision Making 26 year old male who presents for intoxication via EMS.  Reportedly alcohol and cocaine  use.  Hypertensive on intake, vitals otherwise normal.  Patient difficult to redirect, getting up and ambulating throughout the emergency department admitted to the [facility].  Ambulatory though clearly intoxicated, stumbling.  Unfortunately not safe for discharge at this time.  Patient continues to be agitated, IM medications ordered for patient and ED staff safety.  Risk Prescription drug management.   ***  {Document critical care time when appropriate:1} {Document review of labs and clinical decision tools ie heart score, Chads2Vasc2 etc:1}  {Document your independent review of radiology images, and any outside records:1} {Document your discussion with family members, caretakers, and with consultants:1} {Document social determinants of health affecting pt's care:1} {Document your decision making why or why not admission, treatments were needed:1} Final Clinical Impression(s) / ED Diagnoses Final diagnoses:  None    Rx / DC Orders ED Discharge Orders     None

## 2022-04-02 ENCOUNTER — Emergency Department (HOSPITAL_COMMUNITY): Payer: Self-pay

## 2022-04-02 LAB — COMPREHENSIVE METABOLIC PANEL
ALT: 640 U/L — ABNORMAL HIGH (ref 0–44)
AST: 341 U/L — ABNORMAL HIGH (ref 15–41)
Albumin: 4.1 g/dL (ref 3.5–5.0)
Alkaline Phosphatase: 102 U/L (ref 38–126)
Anion gap: 9 (ref 5–15)
BUN: 8 mg/dL (ref 6–20)
CO2: 24 mmol/L (ref 22–32)
Calcium: 8.1 mg/dL — ABNORMAL LOW (ref 8.9–10.3)
Chloride: 106 mmol/L (ref 98–111)
Creatinine, Ser: 0.82 mg/dL (ref 0.61–1.24)
GFR, Estimated: >60 mL/min (ref >60)
Glucose, Bld: 139 mg/dL — ABNORMAL HIGH (ref 70–99)
Potassium: 3.6 mmol/L (ref 3.5–5.1)
Sodium: 139 mmol/L (ref 135–145)
Total Bilirubin: 0.9 mg/dL (ref 0.3–1.2)
Total Protein: 7.5 g/dL (ref 6.5–8.1)

## 2022-04-02 LAB — CBC WITH DIFFERENTIAL/PLATELET
Abs Immature Granulocytes: 0.03 10*3/uL (ref 0.00–0.07)
Basophils Absolute: 0 10*3/uL (ref 0.0–0.1)
Basophils Relative: 0 %
Eosinophils Absolute: 0.1 10*3/uL (ref 0.0–0.5)
Eosinophils Relative: 1 %
HCT: 46.6 % (ref 39.0–52.0)
Hemoglobin: 15.6 g/dL (ref 13.0–17.0)
Immature Granulocytes: 1 %
Lymphocytes Relative: 28 %
Lymphs Abs: 1.8 10*3/uL (ref 0.7–4.0)
MCH: 29.7 pg (ref 26.0–34.0)
MCHC: 33.5 g/dL (ref 30.0–36.0)
MCV: 88.6 fL (ref 80.0–100.0)
Monocytes Absolute: 0.3 10*3/uL (ref 0.1–1.0)
Monocytes Relative: 5 %
Neutro Abs: 4.2 10*3/uL (ref 1.7–7.7)
Neutrophils Relative %: 65 %
Platelets: 223 10*3/uL (ref 150–400)
RBC: 5.26 MIL/uL (ref 4.22–5.81)
RDW: 13.2 % (ref 11.5–15.5)
WBC: 6.4 10*3/uL (ref 4.0–10.5)
nRBC: 0 % (ref 0.0–0.2)

## 2022-04-02 LAB — ETHANOL: Alcohol, Ethyl (B): 233 mg/dL — ABNORMAL HIGH (ref <10)

## 2022-04-02 NOTE — ED Provider Notes (Signed)
Signout from Dr. Ralene Bathe.  26 year old male substance abuse here with agitation altered mental status.  Plan is to reassess when more sober. Physical Exam  BP (!) 101/53 (BP Location: Left Arm)   Pulse 83   Resp 16   Ht 5\' 8"  (1.727 m)   Wt 63.5 kg   SpO2 96%   BMI 21.29 kg/m   Physical Exam  Procedures  Procedures  ED Course / MDM   Clinical Course as of 04/02/22 0823  Nancy Fetter Apr 02, 2022  0016 Patient sleeping calmly in his bed on monitor at this time, after IM medications.  [RS]  [phone] Patient reevaluated by this provider and continues to be sleeping soundly in his hospital bed with normal oxygen saturation and heart rate on bedside monitor.  Will require further time to metabolize from his intoxication and medications administered upon arrival to the ED secondary to agitation. [RS]    Clinical Course User Index [RS] Sponseller, Gypsy Balsam, PA-C   Medical Decision Making Amount and/or Complexity of Data Reviewed Labs: ordered.  Risk Prescription drug management.   Patient is now awake stable on his feet.  Asking to be discharged.  Appears safe for discharge.       Hayden Rasmussen, MD 04/02/22 [phone]

## 2022-06-08 IMAGING — CR DG CHEST 2V
2 series · 2 of 2 positions shown · non-contrast
Comparison: April 23, 2020

CLINICAL DATA: Left-sided chest pain x1 week.

EXAM:
CHEST - 2 VIEW

[w chest pa]
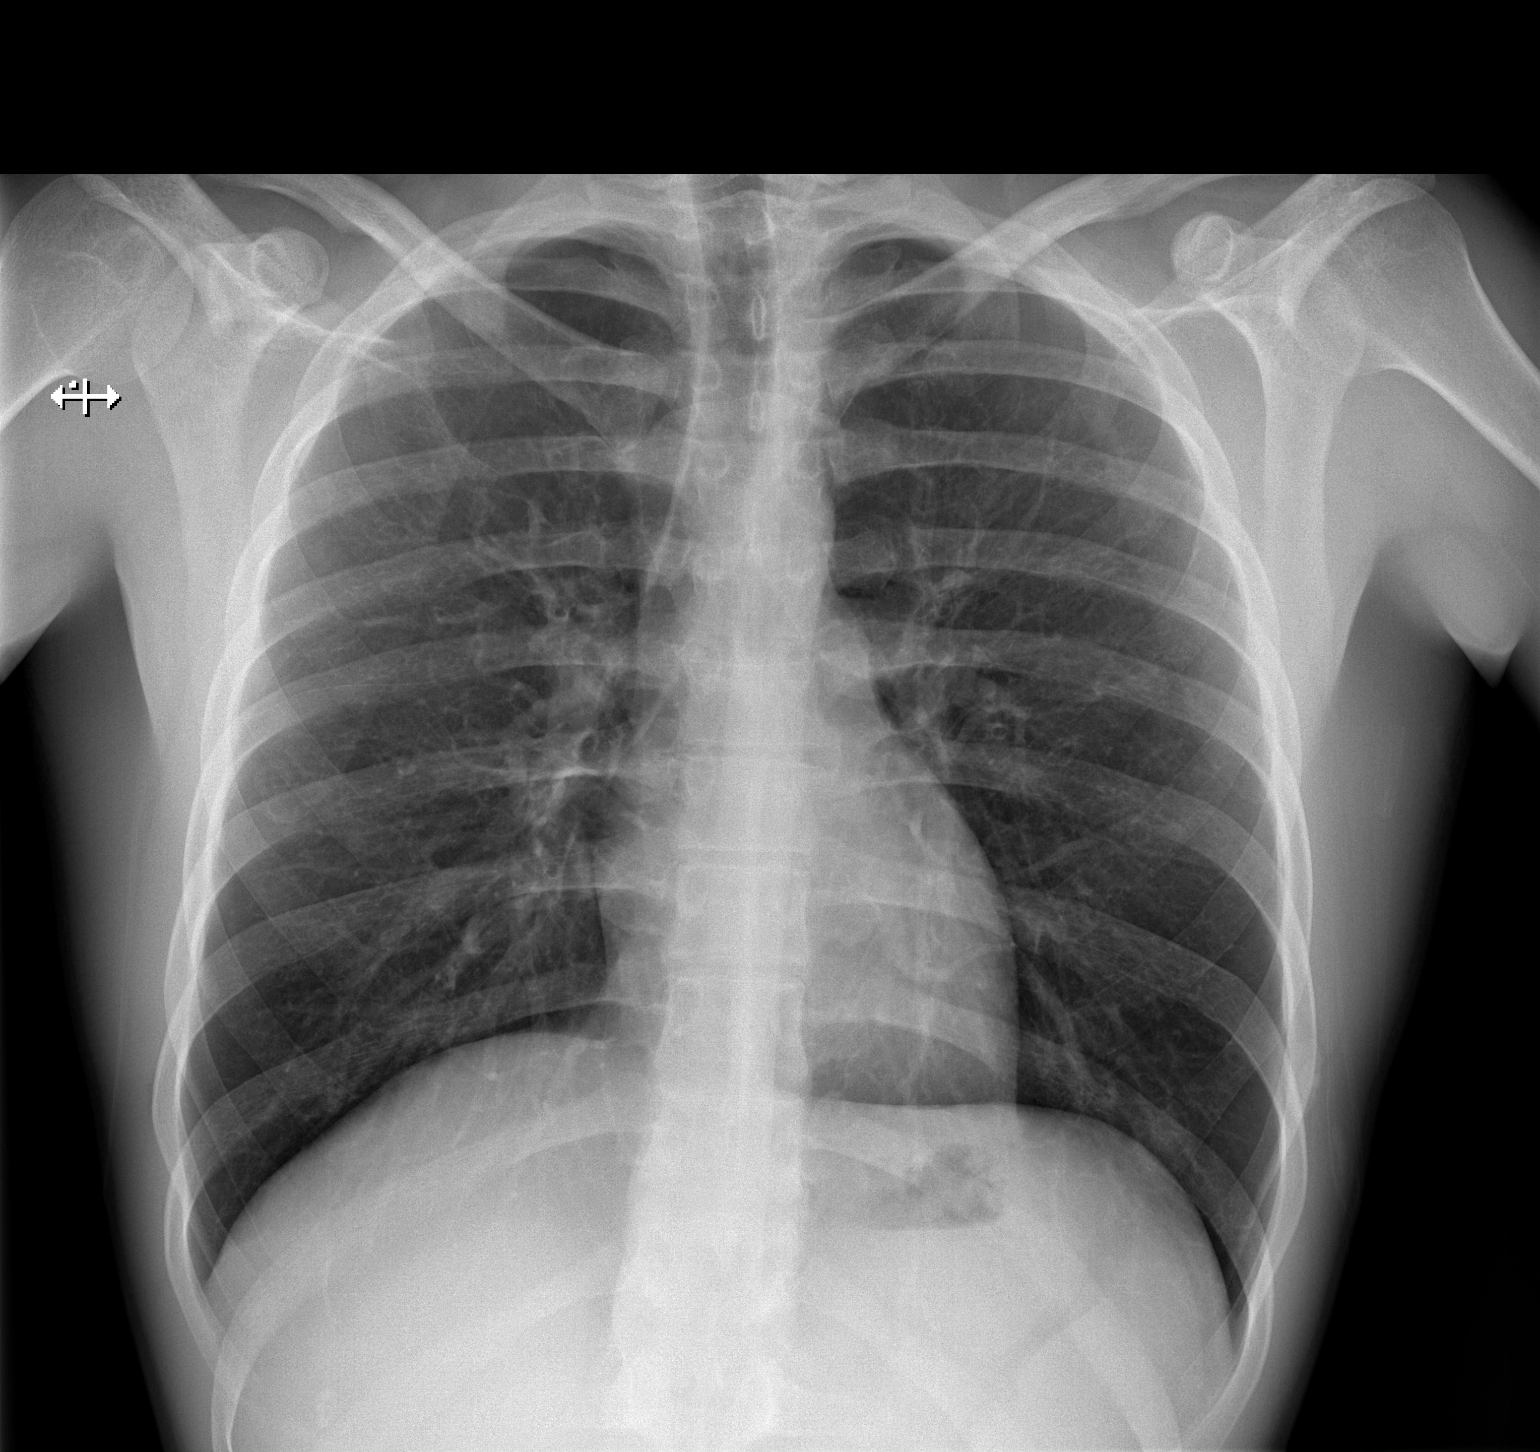

[w chest lat]
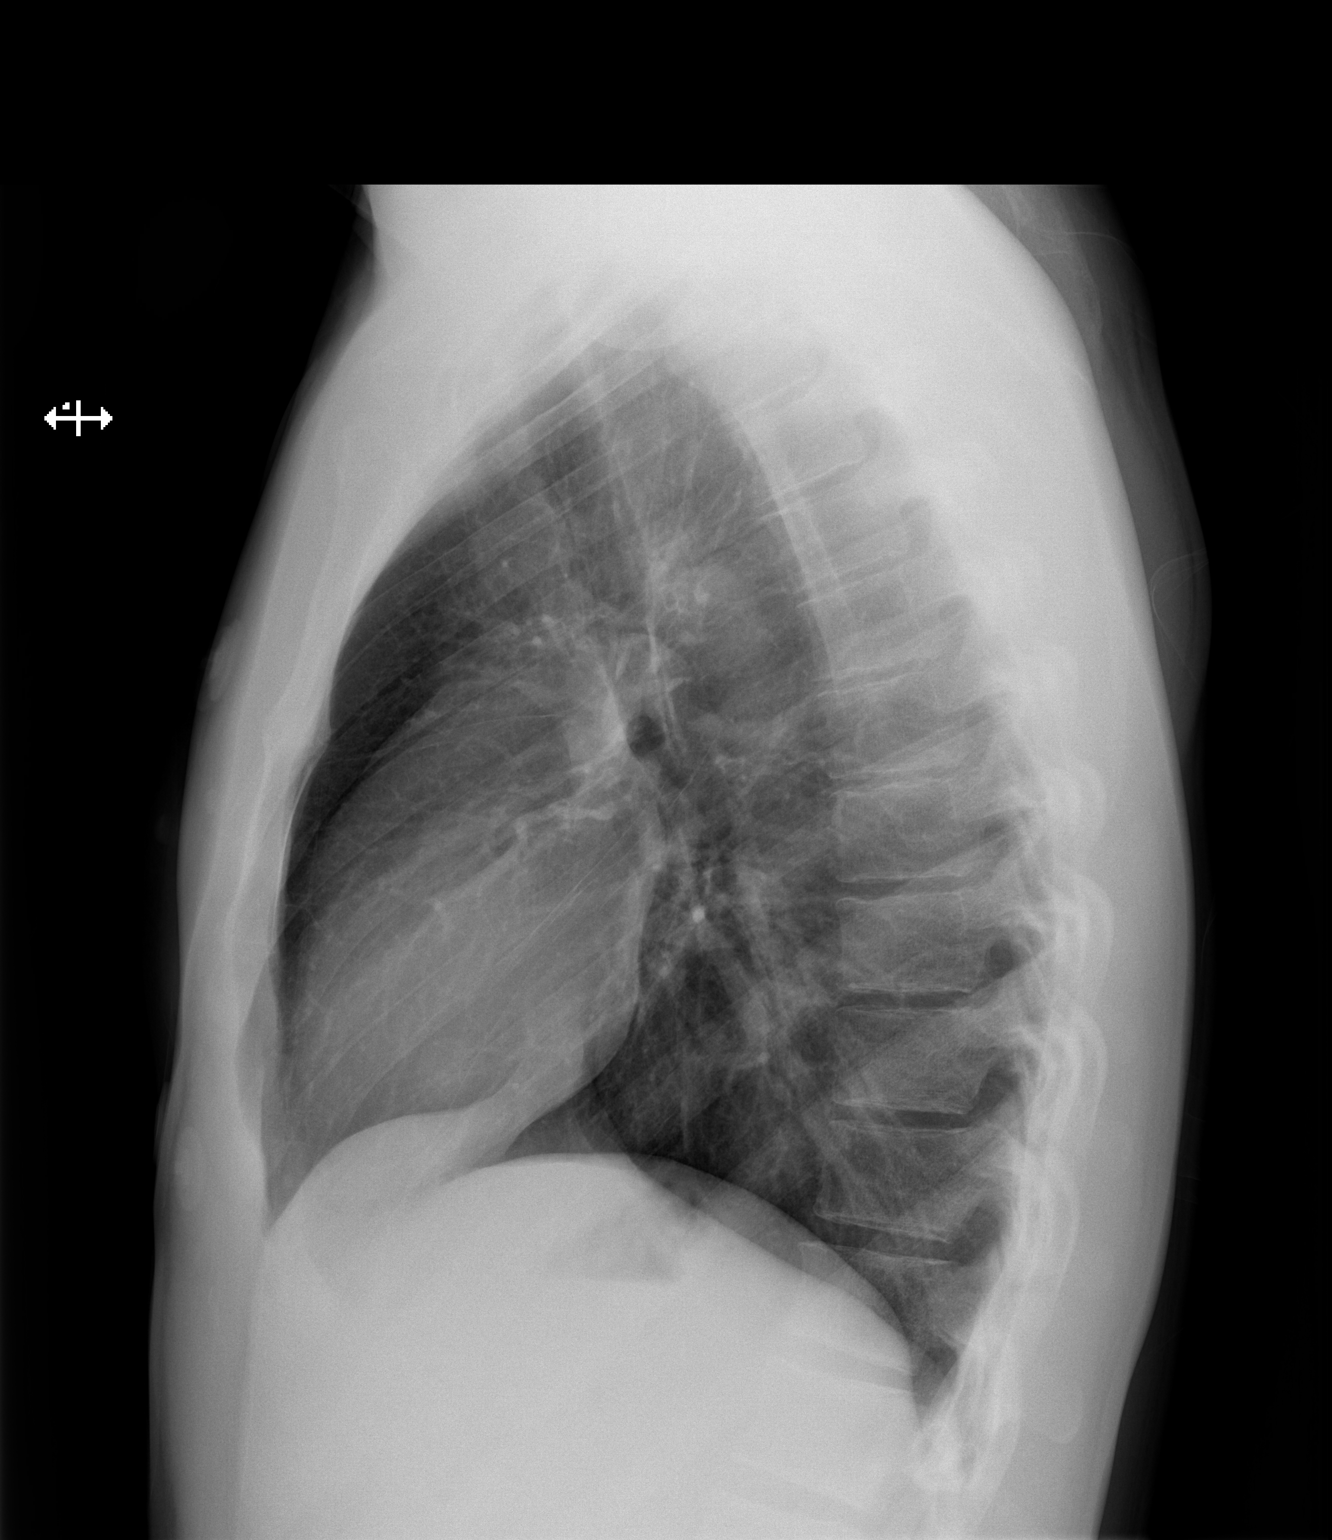

[2 of 2 positions shown; findings below may reference images not displayed]

FINDINGS: The heart size and mediastinal contours are within normal limits.
Both lungs are clear. The visualized skeletal structures are
unremarkable.
IMPRESSION: No active cardiopulmonary disease.

## 2024-03-25 ENCOUNTER — Emergency Department
Admission: EM | Admit: 2024-03-25 | Discharge: 2024-03-25 | Disposition: A | Discharge status: Home / Self Care | Payer: Self-pay | Attending: Emergency Medicine | Admitting: Emergency Medicine

## 2024-03-25 ENCOUNTER — Emergency Department: Payer: Self-pay

## 2024-03-25 ENCOUNTER — Other Ambulatory Visit: Payer: Self-pay

## 2024-03-25 DIAGNOSIS — S62002A Unspecified fracture of navicular [scaphoid] bone of left wrist, initial encounter for closed fracture: Secondary | ICD-10-CM

## 2024-03-25 DIAGNOSIS — S62025A Nondisplaced fracture of middle third of navicular [scaphoid] bone of left wrist, initial encounter for closed fracture: Secondary | ICD-10-CM | POA: Insufficient documentation

## 2024-03-25 MED ORDER — NAPROXEN 500 MG PO TBEC: 500.0000 mg | DELAYED_RELEASE_TABLET | Freq: Two times a day (BID) | ORAL | 0 refills | Status: AC

## 2024-03-25 MED ORDER — IBUPROFEN 800 MG PO TABS
800.0000 mg | ORAL_TABLET | Freq: Once | ORAL | Status: AC
  Administered 2024-03-25: 800 mg via ORAL
  Filled 2024-03-25: qty 1

## 2024-03-25 NOTE — ED Provider Notes (Signed)
 [facility] Provider Note    Event Date/Time   First MD Initiated Contact with Patient 03/25/24 RONOLD     (approximate)   History   Fall and Wrist Injury    HPI  David Hurst is a 28 y.o. male    with a past medical history of alcohol intoxication, constipation, appendicitis, bacterial skin infection, concussion,  who presents to the ED complaining of wrist pain. According to the patient, last Friday, 4 days ago, patient felt on his left hand with a wrist hyperextension.  Patient endorses pain is constant.  Patient works as an Personnel officer.  He is here by himself    There are no active problems to display for this patient.    ROS: Patient currently denies any vision changes, tinnitus, difficulty speaking, facial droop, sore throat, chest pain, shortness of breath, abdominal pain, nausea/vomiting/diarrhea, dysuria, or weakness/numbness/paresthesias in any extremity   Physical Exam   Triage Vital Signs: ED Triage Vitals [03/25/24 1829]  Encounter Vitals Group     BP (!) 143/91     Girls Systolic BP Percentile      Girls Diastolic BP Percentile      Boys Systolic BP Percentile      Boys Diastolic BP Percentile      Pulse Rate 96     Resp 17     Temp 98.7 F (37.1 C)     Temp src      SpO2 97 %     Weight      Height      Head Circumference      Peak Flow      Pain Score 1     Pain Loc      Pain Education      Exclude from Growth Chart     Most recent vital signs: Vitals:   03/25/24 1829  BP: (!) 143/91  Pulse: 96  Resp: 17  Temp: 98.7 F (37.1 C)  SpO2: 97%     Physical Exam Vitals and nursing note reviewed.  During triage patient was hypertensive  Constitutional:      General: Awake and alert. No acute distress.    Appearance: Normal appearance. The patient is normal weight.      Able to speak in complete sentences without cough or dyspnea  HENT:     Head: Normocephalic and atraumatic.     Mouth: Mucous membranes are  moist.  Eyes:     General: PERRL. Normal EOMs          Conjunctiva/sclera: Conjunctivae normal.  Nose No congestion/rhinorrhea  CV:                  Good peripheral perfusion.  Regular rate and rhythm  Resp:               Normal effort.  Equal breath sounds bilaterally.  Abd:                 No distention.  Soft, nontender.  No rebound or guarding.  Musculoskeletal:        General: No swelling. Normal range of motion.  Left hand: Skin is intact no ecchymosis no hematomas.  Mild the formation at the level of the 1st and 2nd metacarpals.  Tenderness to palpation.  Full ROM.  Pulses positive.  Sensation is intact. Skin:    General: Skin is warm and dry.     Capillary Refill: Capillary refill takes less than 2 seconds.  Findings: No rash.  Neurological:     Mental Status: The patient is awake and alert. MAE spontaneously. No gross focal neurologic deficits are appreciated.  Psychiatric Mood and affect are normal. Speech and behavior are normal.  ED Results / Procedures / Treatments   Labs (all labs ordered are listed, but only abnormal results are displayed) Labs Reviewed - No data to display    RADIOLOGY I independently reviewed and interpreted imaging and agree with radiologists findings.     PROCEDURES:  Critical Care performed:   Procedures   MEDICATIONS ORDERED IN ED: Medications  ibuprofen (ADVIL) tablet 800 mg (has no administration in time range)   Clinical Course as of 03/25/24 1953  Tue Mar 25, 2024  1937 DG Wrist Complete Left  Nondisplaced transverse mid scaphoid fracture. [AE]    Clinical Course User Index [AE] Janit Kast, PA-C    IMPRESSION / MDM / ASSESSMENT AND PLAN / ED COURSE  I reviewed the triage vital signs and the nursing notes.  Differential diagnosis includes, but is not limited to, the following fracture, dislocation, unlikely foreign body  Patient's presentation is most consistent with acute complicated illness / injury  requiring diagnostic workup.   David Hurst is a 28 y.o., male who presents today with history of 4 days fall on his left hand.  On a physical exam skin is intact, no ecchymosis no hematomas, mild edema at the level of the 1st and 2nd metacarpals, tender to palpation.  Full ROM.  Sensation intact.  Pulses positive.  Rest of physical exam was normal Plan Ibuprofen Thumb spica Orthopedic referral Patient's diagnosis is consistent with left scaphoid fracture. I independently reviewed and interpreted imaging and agree with radiologists findings.  I did not order any labs. I did review the patient's allergies and medications.The patient is in stable and satisfactory condition for discharge home  Patient will be discharged home with prescriptions for naproxen. Patient is to follow up with orthopedics as needed or otherwise directed. Patient is given ED precautions to return to the ED for any worsening or new symptoms. Discussed plan of care with patient, answered all of patient's questions, Patient agreeable to plan of care. Advised patient to take medications according to the instructions on the label. Discussed possible side effects of new medications. Patient verbalized understanding.  FINAL CLINICAL IMPRESSION(S) / ED DIAGNOSES   Final diagnoses:  Closed nondisplaced fracture of scaphoid of left wrist, unspecified portion of scaphoid, initial encounter     Rx / DC Orders   ED Discharge Orders          Ordered    naproxen (EC NAPROSYN) 500 MG EC tablet  2 times daily with meals        03/25/24 1953             Note:  This document was prepared using Dragon voice recognition software and may include unintentional dictation errors.   Janit Kast, PA-C 03/25/24 1953    Levander Slate, MD 03/25/24 2221

## 2024-03-25 NOTE — Discharge Instructions (Signed)
 You have been diagnosed with closed nondisplaced fracture of the left scaphoid.  Please do not submerge your left arm in water.  You can take naproxen 1 tablet every 12 hours for pain.  Please call and make an appointment with orthopedics for a follow-up.  Please come back to ED or go to your PCP if you have new symptoms symptoms worse

## 2024-03-25 NOTE — ED Triage Notes (Signed)
 PT arrives via POV. Pt reports he was riding an electric one wheeled skateboard this past Saturday, states he accidentally fell and landed on his left wrist. Pt AxOx4.

## 2024-06-05 ENCOUNTER — Other Ambulatory Visit: Payer: Self-pay

## 2024-06-05 DIAGNOSIS — S62025D Nondisplaced fracture of middle third of navicular [scaphoid] bone of left wrist, subsequent encounter for fracture with routine healing: Secondary | ICD-10-CM

## 2024-06-13 ENCOUNTER — Ambulatory Visit: Payer: Self-pay
